# Patient Record
Sex: Male | Born: 1955 | Race: White | Hispanic: No | Marital: Single | Smoking: Current every day smoker
Health system: Southern US, Community
[De-identification: ages and names within clinical notes are randomized; demographics above are authoritative.]

---

## 2008-07-03 ENCOUNTER — Encounter (INDEPENDENT_AMBULATORY_CARE_PROVIDER_SITE_OTHER): Payer: Self-pay | Admitting: General Surgery

## 2008-07-03 ENCOUNTER — Observation Stay (HOSPITAL_COMMUNITY): Admission: EM | Admit: 2008-07-03 | Discharge: 2008-07-04 | Payer: Self-pay | Admitting: Emergency Medicine

## 2010-06-27 LAB — BASIC METABOLIC PANEL
BUN: 17 mg/dL (ref 6–23)
Creatinine, Ser: 1.37 mg/dL (ref 0.4–1.5)
GFR calc Af Amer: >60 mL/min (ref >60)
GFR calc non Af Amer: 55 mL/min — ABNORMAL LOW (ref >60)
Potassium: 3.7 mEq/L (ref 3.5–5.1)

## 2010-06-27 LAB — CBC
Platelets: 238 10*3/uL (ref 150–400)
WBC: 5.4 10*3/uL (ref 4.0–10.5)

## 2010-06-27 LAB — URINALYSIS, ROUTINE W REFLEX MICROSCOPIC
Ketones, ur: NEGATIVE mg/dL
Nitrite: NEGATIVE
Protein, ur: NEGATIVE mg/dL
pH: 6 (ref 5.0–8.0)

## 2010-06-27 LAB — DIFFERENTIAL
Lymphocytes Relative: 23 % (ref 12–46)
Lymphs Abs: 1.2 10*3/uL (ref 0.7–4.0)
Neutro Abs: 3.5 10*3/uL (ref 1.7–7.7)
Neutrophils Relative %: 64 % (ref 43–77)

## 2010-06-27 LAB — PROTIME-INR
INR: 1 (ref 0.00–1.49)
Prothrombin Time: 13.3 seconds (ref 11.6–15.2)

## 2010-07-31 NOTE — H&P (Signed)
Gabriel Estes, MCCUE NO.:  [id number]   MEDICAL RECORD NO.:  [id number]          PATIENT TYPE:  INP   LOCATION:  5121                         FACILITY:  [facility]   PHYSICIAN:  Juanetta Gosling, MDDATE OF BIRTH:  [birth year]   DATE OF ADMISSION:  07/03/2008  DATE OF DISCHARGE:                              HISTORY & PHYSICAL   CHIEF COMPLAINT:  Right groin pain.   CONSULTING PHYSICIAN:  Samuel Jester, DO   HISTORY OF PRESENT ILLNESS:  This is a 55 year old male with about a  month history of an increasing in size right groin bulge that over the  last 24 hours have become increasingly tender and he came to the  emergency room due to his significant tenderness, still passing gas, and  having bowel movements.  No nausea, vomiting, or fevers are noted.   PAST MEDICAL HISTORY:  Significant for that he is a recovering addict.   PAST SURGICAL HISTORY:  Left quadriceps and right foot surgery.   FAMILY HISTORY:  Noncontributory.   SOCIAL HISTORY:  Nonsmoker.  No alcohol.  He is a recovering drug  addict.   ALLERGIES:  No known drug allergies.   MEDICATIONS:  None.   REVIEW OF SYSTEMS:  Otherwise negative.   PHYSICAL EXAMINATION:  VITAL SIGNS:  98.0, 98, respirations 18.  GENERAL:  He is well-appearing male in no distress.  NECK:  Supple.  HEART:  Regular rate and rhythm.  LUNGS:  Clear bilaterally.  ABDOMEN:  Soft, nontender, nondistended.  GU:  No left inguinal hernia what appears to be an incarcerated right  inguinal hernia, is very tender although is very difficult to examine  him due to his tenderness.  The area in his scrotum is hard.  EXTREMITIES:  No edema.   LABORATORY EVALUATION:  A white blood cell count of 5.4, hematocrit  41.5.  Urinalysis negative.  BUN and creatinine are 17 and 1.37, glucose  of 91.   IMPRESSION:  Incarcerated right inguinal hernia.   PLAN:  Open right inguinal repair with mesh.      Juanetta Gosling, MD  Electronically Signed     MCW/MEDQ  D:  07/03/2008  T:  07/04/2008  Job:  [id number]

## 2010-07-31 NOTE — Op Note (Signed)
Gabriel Estes, Gabriel Estes NO.:  [id number]   MEDICAL RECORD NO.:  [id number]          PATIENT TYPE:  INP   LOCATION:  5121                         FACILITY:  [facility]   PHYSICIAN:  Juanetta Gosling, MDDATE OF BIRTH:  1955/12/03   DATE OF PROCEDURE:  DATE OF DISCHARGE:                               OPERATIVE REPORT   PREOPERATIVE DIAGNOSIS:  Incarcerated right inguinal hernia.   POSTOPERATIVE DIAGNOSES:  1. Right inguinal hernia direct.  2. Right hydrocele.   PROCEDURES:  1. Right inguinal repair with Ultrapro patch.  2. Right hydrocelectomy.   SURGEON:  Juanetta Gosling, MD   ASSISTANT:  Clovis Pu. Cornett, MD   ANESTHESIA:  General endotracheal.  Supervising anesthesiologist, Germaine Pomfret, MD   ESTIMATED BLOOD LOSS:  Minimal.   COMPLICATIONS:  None.   DRAINS:  None.   SPECIMENS:  Hydrocele to Pathology.   DISPOSITION:  To recovery room in stable condition.   INDICATIONS:  This is a 55 year old male with an increase in size right  groin mass over the last month that has over the last 24 hours have  become increasingly tender requiring him to come into the emergency  room.  On exam, it appeared that he had an incarcerated right inguinal  hernia with a very hard tender mass in his scrotum and we discussed  going to the operating room to repair that.   PROCEDURE:  After informed consent was obtained, the patient was first  administered 1 g of intravenous cefazolin.  Sequential compression  device was placed in the lower extremities prior to operation.  He was  placed under general endotracheal anesthesia without complication.  His  right groin was then prepped and draped in a standard sterile surgical  fashion.  A surgical time-out was then performed.   A right groin incision was then made and dissection was carried out down  to the level of external abdominal oblique.  There was a fair amount of  spleen of his external abdominal oblique  and a very large bulging area.  His external abdominal oblique was incised and this was carried out to  his internal ring.  Spermatic cord was encircled.  He did have a direct  hernia that I noted, but he also had a very large about 20 x 15-cm tense  right hydrocele.  This was drained with a fair amount of clear fluid  that came out of this.  Then hydrocele sac was then removed and the  testicles were removed to return to the scrotum.  Following this, a cord  lipoma was excised from his spermatic cord as well as the aforementioned  direct hernia was then repaired with an Ultrapro patch and was laid into  position.  This was sutured with 2-0 Prolene to the pubic tubercle, the  inguinal ligament, and to the internal oblique superiorly.  A T-cut was  made in the mesh, wrapped around the spermatic cord, and then tacked  together as well as to the inguinal ligament.  Again the lateral portion  was laid under the external abdominal oblique.  Hemostasis was observed.  The external abdominal oblique was then closed with a 2-0 Vicryl,  Scarpa's with a 3-0 Vicryl, the skin with a 4-0 Monocryl in a  subcuticular fashion.  Dermabond was then placed over this.  A 10 mL of  0.25% Marcaine with epinephrine were infiltrated in the wound as well as  performing ilioinguinal nerve block.  He tolerated this well and was  transferred to the recovery room in stable condition.      Juanetta Gosling, MD  Electronically Signed     MCW/MEDQ  D:  07/03/2008  T:  07/04/2008  Job:  [id number]

## 2016-03-08 ENCOUNTER — Emergency Department (HOSPITAL_COMMUNITY): Payer: Self-pay

## 2016-03-08 ENCOUNTER — Inpatient Hospital Stay (HOSPITAL_COMMUNITY)
Admission: EM | Admit: 2016-03-08 | Discharge: 2016-03-09 | DRG: 190 | Disposition: A | Discharge status: Home / Self Care | Payer: Self-pay | Attending: Internal Medicine | Admitting: Internal Medicine

## 2016-03-08 ENCOUNTER — Encounter (HOSPITAL_COMMUNITY): Payer: Self-pay | Admitting: Emergency Medicine

## 2016-03-08 DIAGNOSIS — Z7952 Long term (current) use of systemic steroids: Secondary | ICD-10-CM

## 2016-03-08 DIAGNOSIS — J9601 Acute respiratory failure with hypoxia: Secondary | ICD-10-CM

## 2016-03-08 DIAGNOSIS — J441 Chronic obstructive pulmonary disease with (acute) exacerbation: Principal | ICD-10-CM

## 2016-03-08 DIAGNOSIS — R05 Cough: Secondary | ICD-10-CM

## 2016-03-08 DIAGNOSIS — R079 Chest pain, unspecified: Secondary | ICD-10-CM

## 2016-03-08 DIAGNOSIS — R55 Syncope and collapse: Secondary | ICD-10-CM

## 2016-03-08 DIAGNOSIS — J189 Pneumonia, unspecified organism: Secondary | ICD-10-CM

## 2016-03-08 DIAGNOSIS — F17210 Nicotine dependence, cigarettes, uncomplicated: Secondary | ICD-10-CM | POA: Diagnosis present

## 2016-03-08 DIAGNOSIS — Z79899 Other long term (current) drug therapy: Secondary | ICD-10-CM

## 2016-03-08 LAB — I-STAT VENOUS BLOOD GAS, ED
Acid-base deficit: 1 mmol/L (ref 0.0–2.0)
Bicarbonate: 24.6 mmol/L (ref 20.0–28.0)
O2 SAT: 87 %
PCO2 VEN: 42.9 mmHg — AB (ref 44.0–60.0)
PH VEN: 7.367 (ref 7.250–7.430)
PO2 VEN: 56 mmHg — AB (ref 32.0–45.0)
TCO2: 26 mmol/L (ref 0–100)

## 2016-03-08 LAB — I-STAT TROPONIN, ED
TROPONIN I, POC: 0.01 ng/mL (ref 0.00–0.08)
TROPONIN I, POC: 0.01 ng/mL (ref 0.00–0.08)

## 2016-03-08 LAB — COMPREHENSIVE METABOLIC PANEL
ALBUMIN: 4 g/dL (ref 3.5–5.0)
ALT: 33 U/L (ref 17–63)
ANION GAP: 13 (ref 5–15)
AST: 41 U/L (ref 15–41)
Alkaline Phosphatase: 72 U/L (ref 38–126)
BILIRUBIN TOTAL: 0.9 mg/dL (ref 0.3–1.2)
BUN: 10 mg/dL (ref 6–20)
CO2: 21 mmol/L — AB (ref 22–32)
Calcium: 8.7 mg/dL — ABNORMAL LOW (ref 8.9–10.3)
Chloride: 108 mmol/L (ref 101–111)
Creatinine, Ser: 1.19 mg/dL (ref 0.61–1.24)
GFR calc non Af Amer: >60 mL/min (ref >60)
GLUCOSE: 84 mg/dL (ref 65–99)
POTASSIUM: 3.7 mmol/L (ref 3.5–5.1)
SODIUM: 142 mmol/L (ref 135–145)
TOTAL PROTEIN: 7.2 g/dL (ref 6.5–8.1)

## 2016-03-08 LAB — CBC WITH DIFFERENTIAL/PLATELET
BASOS PCT: 0 %
Basophils Absolute: 0.1 10*3/uL (ref 0.0–0.1)
EOS ABS: 0 10*3/uL (ref 0.0–0.7)
EOS PCT: 0 %
HCT: 41.6 % (ref 39.0–52.0)
Hemoglobin: 14.2 g/dL (ref 13.0–17.0)
Lymphocytes Relative: 13 %
Lymphs Abs: 2 10*3/uL (ref 0.7–4.0)
MCH: 31.1 pg (ref 26.0–34.0)
MCHC: 34.1 g/dL (ref 30.0–36.0)
MCV: 91 fL (ref 78.0–100.0)
MONO ABS: 0.7 10*3/uL (ref 0.1–1.0)
MONOS PCT: 5 %
Neutro Abs: 12.6 10*3/uL — ABNORMAL HIGH (ref 1.7–7.7)
Neutrophils Relative %: 82 %
Platelets: 280 10*3/uL (ref 150–400)
RBC: 4.57 MIL/uL (ref 4.22–5.81)
RDW: 12.7 % (ref 11.5–15.5)
WBC: 15.4 10*3/uL — ABNORMAL HIGH (ref 4.0–10.5)

## 2016-03-08 LAB — I-STAT CG4 LACTIC ACID, ED: Lactic Acid, Venous: 1.36 mmol/L (ref 0.5–1.9)

## 2016-03-08 LAB — BRAIN NATRIURETIC PEPTIDE: B NATRIURETIC PEPTIDE 5: 237.4 pg/mL — AB (ref 0.0–100.0)

## 2016-03-08 IMAGING — DX DG CHEST 2V
2 series · 2 of 2 positions shown · non-contrast
Comparison: None

CLINICAL DATA: Cough for 3 weeks, passed out after coughing episode
today

EXAM:
CHEST  2 VIEW

[chest pa]
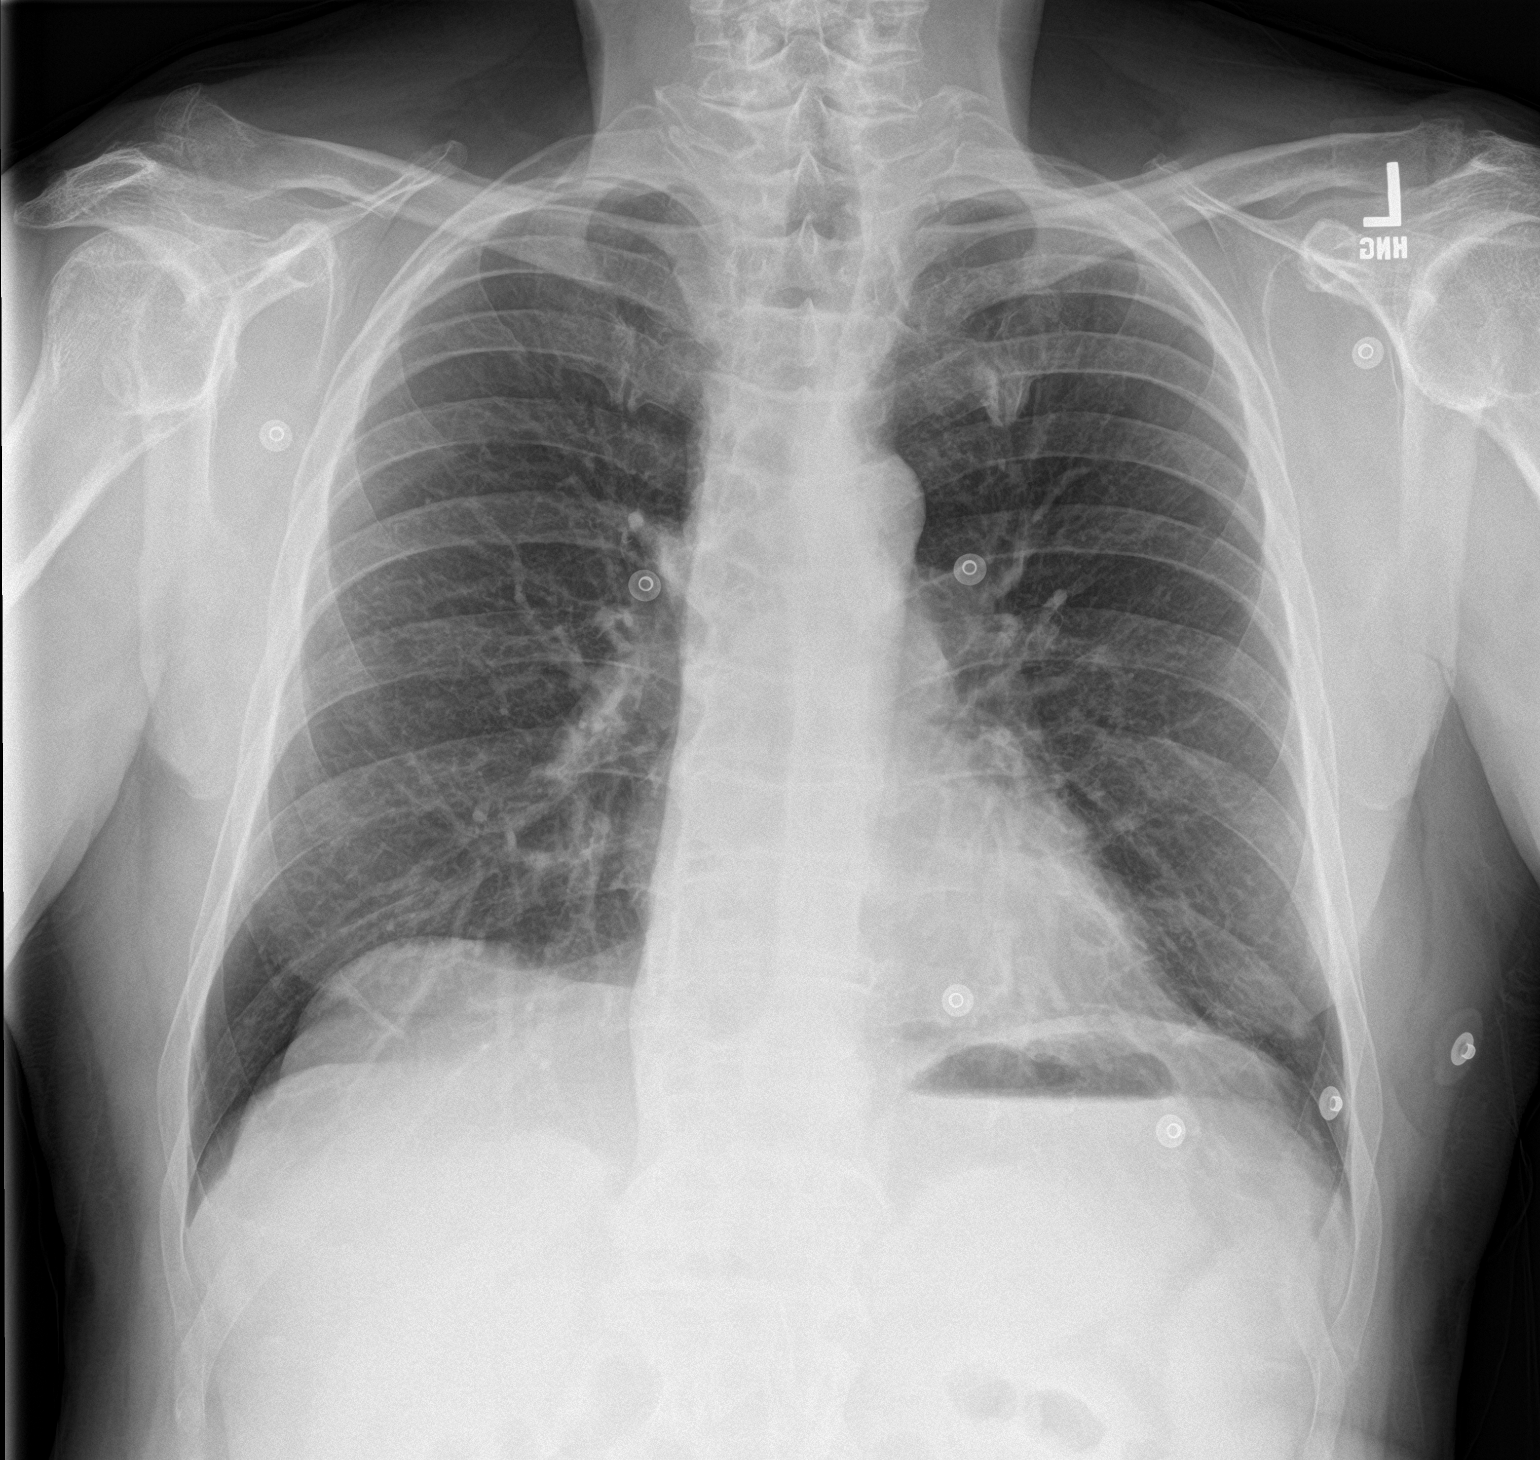

[chest lat]
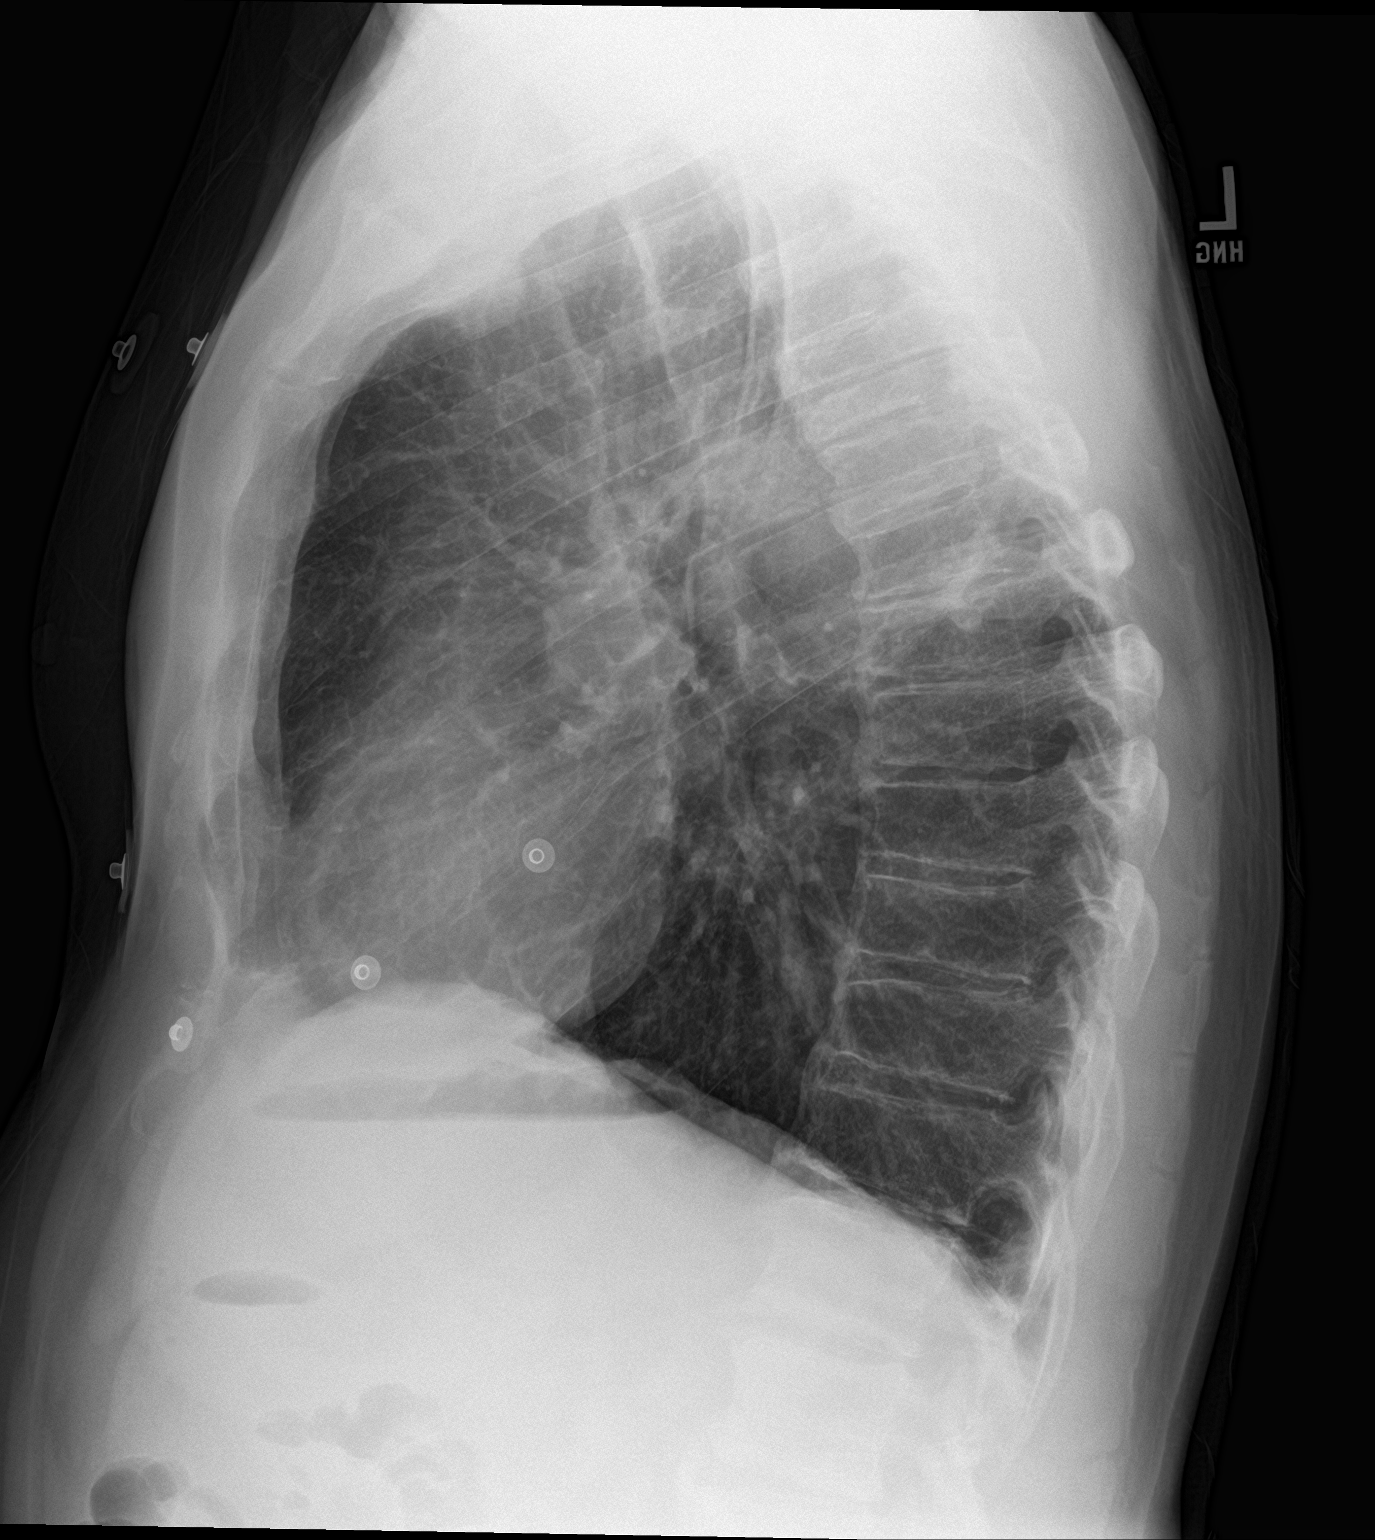

[2 of 2 positions shown; findings below may reference images not displayed]

FINDINGS: Normal heart size, mediastinal contours, and pulmonary vascularity.

Lungs hyperinflated but clear.

No pulmonary infiltrate, pleural effusion or pneumothorax.

Bones unremarkable.
IMPRESSION: Hyperinflated lungs without infiltrate.

## 2016-03-08 MED ORDER — AMOXICILLIN-POT CLAVULANATE 875-125 MG PO TABS: 1.0000 | ORAL_TABLET | Freq: Once | ORAL | Status: DC

## 2016-03-08 MED ORDER — SODIUM CHLORIDE 0.9 % IV BOLUS (SEPSIS)
1000.0000 mL | Freq: Once | INTRAVENOUS | Status: AC
  Administered 2016-03-08: 1000 mL via INTRAVENOUS

## 2016-03-08 MED ORDER — ALBUTEROL SULFATE HFA 108 (90 BASE) MCG/ACT IN AERS: 2.0000 | INHALATION_SPRAY | Freq: Once | RESPIRATORY_TRACT | Status: DC

## 2016-03-08 MED ORDER — MAGNESIUM SULFATE 2 GM/50ML IV SOLN
2.0000 g | Freq: Once | INTRAVENOUS | Status: AC
  Administered 2016-03-08: 2 g via INTRAVENOUS
  Filled 2016-03-08: qty 50

## 2016-03-08 MED ORDER — IOPAMIDOL (ISOVUE-370) INJECTION 76%
INTRAVENOUS | Status: AC
  Administered 2016-03-08: 100 mL
  Filled 2016-03-08: qty 100

## 2016-03-08 MED ORDER — IPRATROPIUM-ALBUTEROL 0.5-2.5 (3) MG/3ML IN SOLN
3.0000 mL | Freq: Once | RESPIRATORY_TRACT | Status: AC
  Administered 2016-03-08: 3 mL via RESPIRATORY_TRACT
  Filled 2016-03-08: qty 3

## 2016-03-08 MED ORDER — AMOXICILLIN-POT CLAVULANATE 875-125 MG PO TABS: 1.0000 | ORAL_TABLET | Freq: Two times a day (BID) | ORAL | 0 refills | Status: DC

## 2016-03-08 MED ORDER — HYDROCODONE-HOMATROPINE 5-1.5 MG/5ML PO SYRP: 5.0000 mL | ORAL_SOLUTION | Freq: Four times a day (QID) | ORAL | 0 refills | Status: DC | PRN

## 2016-03-08 MED ORDER — PREDNISONE 20 MG PO TABS: 60.0000 mg | ORAL_TABLET | Freq: Every day | ORAL | 0 refills | Status: DC

## 2016-03-08 MED ORDER — METHYLPREDNISOLONE SODIUM SUCC 125 MG IJ SOLR
125.0000 mg | Freq: Once | INTRAMUSCULAR | Status: AC
  Administered 2016-03-08: 125 mg via INTRAVENOUS
  Filled 2016-03-08: qty 2

## 2016-03-08 MED ORDER — AZITHROMYCIN 250 MG PO TABS: 500.0000 mg | ORAL_TABLET | Freq: Once | ORAL | Status: DC

## 2016-03-08 MED ORDER — DEXTROSE 5 % IV SOLN
500.0000 mg | Freq: Once | INTRAVENOUS | Status: AC
  Administered 2016-03-09: 500 mg via INTRAVENOUS
  Filled 2016-03-08: qty 500

## 2016-03-08 MED ORDER — SODIUM CHLORIDE 0.9 % IV BOLUS (SEPSIS)
1000.0000 mL | Freq: Once | INTRAVENOUS | Status: AC
Start: 2016-03-08 — End: 2016-03-09
  Administered 2016-03-08: 1000 mL via INTRAVENOUS

## 2016-03-08 MED ORDER — BENZONATATE 100 MG PO CAPS: 100.0000 mg | ORAL_CAPSULE | Freq: Three times a day (TID) | ORAL | 0 refills | Status: DC | PRN

## 2016-03-08 MED ORDER — AZITHROMYCIN 250 MG PO TABS: 250.0000 mg | ORAL_TABLET | Freq: Every day | ORAL | 0 refills | Status: DC

## 2016-03-08 NOTE — ED Notes (Signed)
Admitting at bedside 

## 2016-03-08 NOTE — ED Notes (Signed)
[place], [place] [facility], who stated [facility] [address] [place]. Relayed this information to pt's sig other, who says she is going to call [employer] to see if his truck is still there.

## 2016-03-08 NOTE — ED Notes (Signed)
Pt transported to xray 

## 2016-03-08 NOTE — ED Provider Notes (Signed)
[facility] Provider Note   CSN: [id number] Arrival date & time: 03/08/16  1827     History   Chief Complaint Chief Complaint  Patient presents with  . Shortness of Breath    HPI Gabriel Estes is a 60 y.o. male.  HPI 60 yo M with PMHx of tobacco use here with syncopal episode while coughing. Pt states that over the past several days, he has had progressively worsening cough, nasal congestion, and sputum production. He is having frequent, severe coughing spells followed by transient lightheadedness. Denies any associated chest pain. Earlier today, he was sitting in his car, parked, when he felt a spell come on. He believes that he then passed out and awoke to EMS. Currently, he states he feels SOB but denies any other complaints. No fevers. No h/o syncopal episodes. No coronary history. He does have known sick contacts.  History reviewed. No pertinent past medical history.  There are no active problems to display for this patient.   History reviewed. No pertinent surgical history.     Home Medications    Prior to Admission medications   Medication Sig Start Date End Date Taking? Authorizing Provider  amoxicillin-clavulanate (AUGMENTIN) 875-125 MG tablet Take 1 tablet by mouth every 12 (twelve) hours. 03/08/16 03/15/16  Shaune Pollackameron Shawnee Higham, MD  azithromycin (ZITHROMAX) 250 MG tablet Take 1 tablet (250 mg total) by mouth daily. 03/08/16 03/12/16  Shaune Pollackameron Jaylyne Breese, MD  benzonatate (TESSALON PERLES) 100 MG capsule Take 1 capsule (100 mg total) by mouth 3 (three) times daily as needed for cough. 03/08/16   Shaune Pollackameron Anam Lukas, MD  HYDROcodone-homatropine [facility](HYCODAN) 5-1.5 MG/5ML syrup Take 5 mLs by mouth every 6 (six) hours as needed for cough. 03/08/16   Shaune Pollackameron Yatziri Wainwright, MD  predniSONE (DELTASONE) 20 MG tablet Take 3 tablets (60 mg total) by mouth daily. 03/08/16 03/13/16  Shaune Pollackameron Eydie Wormley, MD    Family History History reviewed. No pertinent family history.  Social History Social  History  Substance Use Topics  . Smoking status: Current Every Day Smoker    Packs/day: 0.50    Types: Cigarettes  . Smokeless tobacco: Never Used     Comment: would like to try Chantix  . Alcohol use Yes     Comment: occ     Allergies   Patient has no known allergies.   Review of Systems Review of Systems  Constitutional: Positive for chills and fatigue. Negative for fever.  HENT: Negative for congestion and rhinorrhea.   Eyes: Negative for visual disturbance.  Respiratory: Positive for cough, shortness of breath and wheezing.   Cardiovascular: Negative for chest pain and leg swelling.  Gastrointestinal: Negative for abdominal pain, diarrhea, nausea and vomiting.  Genitourinary: Negative for dysuria and flank pain.  Musculoskeletal: Negative for neck pain and neck stiffness.  Skin: Negative for rash and wound.  Allergic/Immunologic: Negative for immunocompromised state.  Neurological: Positive for syncope. Negative for weakness and headaches.  All other systems reviewed and are negative.    Physical Exam Updated Vital Signs BP 124/92   Pulse 107   Temp 97.9 F (36.6 C)   Resp 16   SpO2 95%   Physical Exam  Constitutional: He is oriented to person, place, and time. He appears well-developed and well-nourished. No distress.  HENT:  Head: Normocephalic and atraumatic.  Eyes: Conjunctivae are normal.  Neck: Neck supple.  Cardiovascular: Regular rhythm and normal heart sounds.  Tachycardia present.  Exam reveals no friction rub.   No murmur heard. Pulmonary/Chest: Effort normal. Tachypnea noted. No  respiratory distress. He has decreased breath sounds. He has wheezes in the right lower field and the left lower field. He has no rales.  Abdominal: He exhibits no distension.  Musculoskeletal: He exhibits no edema.  Neurological: He is alert and oriented to person, place, and time. He exhibits normal muscle tone.  Skin: Skin is warm. Capillary refill takes less than 2  seconds.  Psychiatric: He has a normal mood and affect.  Nursing note and vitals reviewed.    ED Treatments / Results  Labs (all labs ordered are listed, but only abnormal results are displayed) Labs Reviewed  CBC WITH DIFFERENTIAL/PLATELET - Abnormal; Notable for the following:       Result Value   WBC 15.4 (*)    Neutro Abs 12.6 (*)    All other components within normal limits  COMPREHENSIVE METABOLIC PANEL - Abnormal; Notable for the following:    CO2 21 (*)    Calcium 8.7 (*)    All other components within normal limits  BRAIN NATRIURETIC PEPTIDE - Abnormal; Notable for the following:    B Natriuretic Peptide 237.4 (*)    All other components within normal limits  I-STAT VENOUS BLOOD GAS, ED - Abnormal; Notable for the following:    pCO2, Ven 42.9 (*)    pO2, Ven 56.0 (*)    All other components within normal limits  BORDETELLA PERTUSSIS PCR  INFLUENZA PANEL BY PCR (TYPE A & B, H1N1)  I-STAT CG4 LACTIC ACID, ED  I-STAT TROPOININ, ED  I-STAT CG4 LACTIC ACID, ED  Rosezena SensorI-STAT TROPOININ, ED    EKG  EKG Interpretation  Date/Time:  Friday March 08 2016 18:44:49 EST Ventricular Rate:  108 PR Interval:    QRS Duration: 90 QT Interval:  356 QTC Calculation: 478 R Axis:   124 Text Interpretation:  Sinus tachycardia Probable right ventricular hypertrophy Borderline prolonged QT interval No old tracing to compare Confirmed by Josyah Achor MD, Lavell Ridings [phone]([id number]) on 03/08/2016 6:47:40 PM       Radiology Dg Chest 2 View  Result Date: 03/08/2016 CLINICAL DATA:  Cough for 3 weeks, passed out after coughing episode today EXAM: CHEST  2 VIEW COMPARISON:  None FINDINGS: Normal heart size, mediastinal contours, and pulmonary vascularity. Lungs hyperinflated but clear. No pulmonary infiltrate, pleural effusion or pneumothorax. Bones unremarkable. IMPRESSION: Hyperinflated lungs without infiltrate. Electronically Signed   By: Ulyses SouthwardMark  Boles M.D.   On: 03/08/2016 19:19   Ct Angio Chest Pe W Or  Wo Contrast  Result Date: 03/08/2016 CLINICAL DATA:  Chest pain with shortness of breath EXAM: CT ANGIOGRAPHY CHEST WITH CONTRAST TECHNIQUE: Multidetector CT imaging of the chest was performed using the standard protocol during bolus administration of intravenous contrast. Multiplanar CT image reconstructions and MIPs were obtained to evaluate the vascular anatomy. CONTRAST:  100 mL Isovue 370 intravenous COMPARISON:  Chest x-ray 03/08/2016 FINDINGS: Cardiovascular: Satisfactory opacification of the pulmonary arteries to the segmental level. No evidence of pulmonary embolism. Normal heart size. No pericardial effusion. No aortic dissection. Thoracic aorta is non aneurysmal. Minimal coronary artery calcifications. Mediastinum/Nodes: No significantly enlarged mediastinal or hilar lymph nodes. Trachea is midline. Thyroid normal. Esophagus within normal limits. Lungs/Pleura: No pneumothorax. No pleural effusion or consolidation. Minimal right upper lobe subpleural hazy density with minimal centrilobular pattern. Upper Abdomen: No acute abnormalities in the upper abdomen. Musculoskeletal: Degenerative changes.  No suspicious bone lesions. Review of the MIP images confirms the above findings. IMPRESSION: 1. No definite CT evidence for acute pulmonary embolus or aortic dissection 2.  Minimal hazy density and questionable centrilobular pattern in the peripheral right upper lobe could relate to small focus of pneumonitis or small airways infection/bronchiolitis. There is no focal pneumonia or pleural effusion identified. Electronically Signed   By: Jasmine Pang M.D.   On: 03/08/2016 21:41    Procedures Procedures (including critical care time)  Medications Ordered in ED Medications  sodium chloride 0.9 % bolus 1,000 mL (not administered)  ipratropium-albuterol (DUONEB) 0.5-2.5 (3) MG/3ML nebulizer solution 3 mL (not administered)  azithromycin (ZITHROMAX) 500 mg in dextrose 5 % 250 mL IVPB (not administered)    magnesium sulfate IVPB 2 g 50 mL (not administered)  sodium chloride 0.9 % bolus 1,000 mL (0 mLs Intravenous Stopped 03/08/16 2138)  methylPREDNISolone sodium succinate (SOLU-MEDROL) 125 mg/2 mL injection 125 mg (125 mg Intravenous Given 03/08/16 1917)  ipratropium-albuterol (DUONEB) 0.5-2.5 (3) MG/3ML nebulizer solution 3 mL (3 mLs Nebulization Given 03/08/16 1917)  ipratropium-albuterol (DUONEB) 0.5-2.5 (3) MG/3ML nebulizer solution 3 mL (3 mLs Nebulization Given 03/08/16 2136)  iopamidol (ISOVUE-370) 76 % injection (100 mLs  Contrast Given 03/08/16 2101)     Initial Impression / Assessment and Plan / ED Course  I have reviewed the triage vital signs and the nursing notes.  Pertinent labs & imaging results that were available during my care of the patient were reviewed by me and considered in my medical decision making (see chart for details).  Clinical Course     60 yo M with PMHx of tobacco dependence here with syncopal episode after coughing, with several days of cough, SOB, wheezing. On arrival, pt tachycardic, tachypneic, with diffuse wheezing. CXR clear. CTA PE obtained 2/2 syncope with CP and shows small airway disease, no focal PNA, and no PE. Labs o/w with mild, likely reactive leukocytosis and mild dehydration. Suspect COPD/WARI with cough syncope. Pt persistently wheezing after steroids, nebs x 3 and is hypoxic to 86% on RA when in room, even at rest. Will admit for hypoxic resp failure ad continued management. Will also add on pertussis and start azithro.  Final Clinical Impressions(s) / ED Diagnoses   Final diagnoses:  Chest pain  Community acquired pneumonia of right lung, unspecified part of lung  Cough syncope      Shaune Pollack, MD 03/08/16 2222

## 2016-03-08 NOTE — ED Triage Notes (Signed)
Per [facility]: Pt to ED after [facility] [place] [facility] [place] [facility] Fire[facility] got him out and administered Narcan, pt denies using drugs/alcohol. He did come around soon after and vomited en route. Pt c/o upper respiratory symptoms the past couple weeks and general "not feeling well." Pt given 4mg  zofran and 5mg  albuterol neb. EMS VS: 92% RA, 96% on 4L O2 [place]. NSR 92, 124/86, RR 16. Pt initially repetitive with questions after coming to, but states the medics and firemen were just overwhelming. Pt currently A&O x 4, denies pain. States the last thing he remembers is sitting in his truck having a bad coughing spell. Resp e/u at this time.

## 2016-03-08 NOTE — H&P (Addendum)
Gabriel Estes:[id number] DOB: January 23, 1956 DOA: 03/08/2016     PCP: [facility]ists: none Patient coming from:   home Lives alone,       Chief Complaint: syncope after a coughing spell   HPI: Gabriel Estes is a 60 y.o. male with medical history significant of tobacco abuse    Presented with episode of syncope that happened after he was having severe coughing spasm he was found unresponsive and pickup truck at the [place] station 5 department administered Narcan patient denies any drug abuse patient did Y Coppock. Patient had an episode of vomiting and Route reports has been having shortness of breath and coughing and wheezing for past few weeks and overall not feeling well. In route patient was given 5 mg of albuterol was found to have 92% on Ramirez 96% 4 L initial heart rate 92 BP 124/84 he had had a pain or chest pains or significant shortness of breath states he passed out after he was having severe coughing spell she remembers feeling lightheaded and going out. Cough with significant sputum production he also had had some Raynaud's. Patient is a smoker but currently interested in quitting he have not had any fevers or chills no prior syncopal episodes has no history of coronary artery disease status denies any sick contacts. Reports some night sweats.  He reports right side discomfort with movement.    IN ER:  Temp (24hrs), Avg:97.9 F (36.6 C), Min:97.9 F (36.6 C), Max:97.9 F (36.6 C)   RR 26 satting 96% on 3 L HR 113 BP 124/92   troponin 0.01 lactic acid 1.36 VBG 7.367/42.9 WBC 15.4 cranial 1.19 which is baseline BNP 237 Patient was noted to be wheezing in emergency department chest x-ray show no pneumonia CT injury or no PE he was noted to be down to 86% on room air while in the room especially after coughing spell.  Following Medications were ordered in ER: Medications  sodium chloride 0.9 % bolus 1,000 mL (not administered)  ipratropium-albuterol (DUONEB)  0.5-2.5 (3) MG/3ML nebulizer solution 3 mL (not administered)  azithromycin (ZITHROMAX) 500 mg in dextrose 5 % 250 mL IVPB (not administered)  magnesium sulfate IVPB 2 g 50 mL (not administered)  sodium chloride 0.9 % bolus 1,000 mL (0 mLs Intravenous Stopped 03/08/16 2138)  methylPREDNISolone sodium succinate (SOLU-MEDROL) 125 mg/2 mL injection 125 mg (125 mg Intravenous Given 03/08/16 1917)  ipratropium-albuterol (DUONEB) 0.5-2.5 (3) MG/3ML nebulizer solution 3 mL (3 mLs Nebulization Given 03/08/16 1917)  ipratropium-albuterol (DUONEB) 0.5-2.5 (3) MG/3ML nebulizer solution 3 mL (3 mLs Nebulization Given 03/08/16 2136)  iopamidol (ISOVUE-370) 76 % injection (100 mLs  Contrast Given 03/08/16 2101)     Hospitalist was called for admission for Syncope the setting of severe coughing spell patient with acute respiratory failure hypoxia likely secondary to COPD exacerbation  Review of Systems:    Pertinent positives include: shortness of breath at rest dyspnea on exertion, excess mucus,   productive cough, wheezing. vomiting, Constitutional:  No weight loss, night sweats, Fevers, chills, fatigue, weight loss  HEENT:  No headaches, Difficulty swallowing,Tooth/dental problems,Sore throat,  No sneezing, itching, ear ache, nasal congestion, post nasal drip,  Cardio-vascular:  No chest pain, Orthopnea, PND, anasarca, dizziness, palpitations.no Bilateral lower extremity swelling  GI:  No heartburn, indigestion, abdominal pain, nausea, diarrhea, change in bowel habits, loss of appetite, melena, blood in stool, hematemesis Resp:  no .  No non-productive cough, No coughing up of blood.No change in color of mucus.No  Skin:  no rash or lesions. No jaundice GU:  no dysuria, change in color of urine, no urgency or frequency. No straining to urinate.  No flank pain.  Musculoskeletal:  No joint pain or no joint swelling. No decreased range of motion. No back pain.  Psych:  No change in mood or affect.  No depression or anxiety. No memory loss.  Neuro: no localizing neurological complaints, no tingling, no weakness, no double vision, no gait abnormality, no slurred speech, no confusion  As per HPI otherwise 10 point review of systems negative.   Past Medical History: History reviewed. No pertinent past medical history. History reviewed. No pertinent surgical history.   Social History:  Ambulatory   Independently     reports that he has been smoking Cigarettes.  He has been smoking about 0.50 packs per day. He has never used smokeless tobacco. He reports that he drinks alcohol. He reports that he does not use drugs.  Allergies:  No Known Allergies     Family History:   Family History  Problem Relation Age of Onset  . ALS Father   . Cancer Sister   . Hypertension Sister   . Stroke Other   . Cancer Other   . CAD Neg Hx   . Diabetes Neg Hx     Medications: Prior to Admission medications   Medication Sig Start Date End Date Taking? Authorizing Provider  amoxicillin-clavulanate (AUGMENTIN) 875-125 MG tablet Take 1 tablet by mouth every 12 (twelve) hours. 03/08/16 03/15/16  Shaune Pollackameron Isaacs, MD  azithromycin (ZITHROMAX) 250 MG tablet Take 1 tablet (250 mg total) by mouth daily. 03/08/16 03/12/16  Shaune Pollackameron Isaacs, MD  benzonatate (TESSALON PERLES) 100 MG capsule Take 1 capsule (100 mg total) by mouth 3 (three) times daily as needed for cough. 03/08/16   Shaune Pollackameron Isaacs, MD  HYDROcodone-homatropine [facility](HYCODAN) 5-1.5 MG/5ML syrup Take 5 mLs by mouth every 6 (six) hours as needed for cough. 03/08/16   Shaune Pollackameron Isaacs, MD  predniSONE (DELTASONE) 20 MG tablet Take 3 tablets (60 mg total) by mouth daily. 03/08/16 03/13/16  Shaune Pollackameron Isaacs, MD    Physical Exam: Patient Vitals for the past 24 hrs:  BP Temp Pulse Resp SpO2  03/08/16 2250 - - 113 26 96 %  03/08/16 2045 124/92 - 107 16 -  03/08/16 2030 142/91 - 107 22 95 %  03/08/16 2015 138/81 - 109 18 96 %  03/08/16 2000 136/90 - 98 21 96  %  03/08/16 1945 129/82 - 112 - 90 %  03/08/16 1845 118/85 - 109 13 99 %  03/08/16 1838 121/81 97.9 F (36.6 C) 99 20 90 %  03/08/16 1827 - - - - 96 %    1. General:  in No Acute distress 2. Psychological: Alert and    Oriented 3. Head/ENT:   Dry Mucous Membranes                          Head Non traumatic, neck supple                            Poor Dentition 4. SKIN:   decreased Skin turgor,  Skin clean Dry and intact no rash, numerous skin lesions consistent with sun damage possible lesion on the upper back worrisome for malignancy  5. Heart: Regular rate and rhythm no Murmur, Rub or gallop 6. Lungs: Some wheezes no crackles  Coarse breath sounds 7. Abdomen: Soft,  non-tender, Non distended 8. Lower extremities: no clubbing, cyanosis, or edema 9. Neurologically Grossly intact, moving all 4 extremities equally  10. MSK: Normal range of motion   body mass index is unknown because there is no height or weight on file.  Labs on Admission:   Labs on Admission: I have personally reviewed following labs and imaging studies  CBC:  Recent Labs Lab 03/08/16 1937  WBC 15.4*  NEUTROABS 12.6*  HGB 14.2  HCT 41.6  MCV 91.0  PLT 280   Basic Metabolic Panel:  Recent Labs Lab 03/08/16 1937  NA 142  K 3.7  CL 108  CO2 21*  GLUCOSE 84  BUN 10  CREATININE 1.19  CALCIUM 8.7*   GFR: CrCl cannot be calculated (Unknown ideal weight.). Liver Function Tests:  Recent Labs Lab 03/08/16 1937  AST 41  ALT 33  ALKPHOS 72  BILITOT 0.9  PROT 7.2  ALBUMIN 4.0   No results for input(s): LIPASE, AMYLASE in the last 168 hours. No results for input(s): AMMONIA in the last 168 hours. Coagulation Profile: No results for input(s): INR, PROTIME in the last 168 hours. Cardiac Enzymes: No results for input(s): CKTOTAL, CKMB, CKMBINDEX, TROPONINI in the last 168 hours. BNP (last 3 results) No results for input(s): PROBNP in the last 8760 hours. HbA1C: No results for input(s):  HGBA1C in the last 72 hours. CBG: No results for input(s): GLUCAP in the last 168 hours. Lipid Profile: No results for input(s): CHOL, HDL, LDLCALC, TRIG, CHOLHDL, LDLDIRECT in the last 72 hours. Thyroid Function Tests: No results for input(s): TSH, T4TOTAL, FREET4, T3FREE, THYROIDAB in the last 72 hours. Anemia Panel: No results for input(s): VITAMINB12, FOLATE, FERRITIN, TIBC, IRON, RETICCTPCT in the last 72 hours.  Sepsis Labs: @LABRCNTIP (procalcitonin:4,lacticidven:4) )No results found for this or any previous visit (from the past 240 hour(s)).     UA ordered  No results found for: HGBA1C  CrCl cannot be calculated (Unknown ideal weight.).  BNP (last 3 results) No results for input(s): PROBNP in the last 8760 hours.   ECG REPORT  Independently reviewed Rate: 108  Rhythm: Sinus tachycardia ST&T Change: No acute ischemic changes  QTC 478  There were no vitals filed for this visit.   Cultures: No results found for: SDES, SPECREQUEST, CULT, REPTSTATUS   Radiological Exams on Admission: Dg Chest 2 View  Result Date: 03/08/2016 CLINICAL DATA:  Cough for 3 weeks, passed out after coughing episode today EXAM: CHEST  2 VIEW COMPARISON:  None FINDINGS: Normal heart size, mediastinal contours, and pulmonary vascularity. Lungs hyperinflated but clear. No pulmonary infiltrate, pleural effusion or pneumothorax. Bones unremarkable. IMPRESSION: Hyperinflated lungs without infiltrate. Electronically Signed   By: Ulyses Southward M.D.   On: 03/08/2016 19:19   Ct Angio Chest Pe W Or Wo Contrast  Result Date: 03/08/2016 CLINICAL DATA:  Chest pain with shortness of breath EXAM: CT ANGIOGRAPHY CHEST WITH CONTRAST TECHNIQUE: Multidetector CT imaging of the chest was performed using the standard protocol during bolus administration of intravenous contrast. Multiplanar CT image reconstructions and MIPs were obtained to evaluate the vascular anatomy. CONTRAST:  100 mL Isovue 370 intravenous  COMPARISON:  Chest x-ray 03/08/2016 FINDINGS: Cardiovascular: Satisfactory opacification of the pulmonary arteries to the segmental level. No evidence of pulmonary embolism. Normal heart size. No pericardial effusion. No aortic dissection. Thoracic aorta is non aneurysmal. Minimal coronary artery calcifications. Mediastinum/Nodes: No significantly enlarged mediastinal or hilar lymph nodes. Trachea is midline. Thyroid normal. Esophagus within normal limits. Lungs/Pleura: No pneumothorax. No  pleural effusion or consolidation. Minimal right upper lobe subpleural hazy density with minimal centrilobular pattern. Upper Abdomen: No acute abnormalities in the upper abdomen. Musculoskeletal: Degenerative changes.  No suspicious bone lesions. Review of the MIP images confirms the above findings. IMPRESSION: 1. No definite CT evidence for acute pulmonary embolus or aortic dissection 2. Minimal hazy density and questionable centrilobular pattern in the peripheral right upper lobe could relate to small focus of pneumonitis or small airways infection/bronchiolitis. There is no focal pneumonia or pleural effusion identified. Electronically Signed   By: Jasmine PangKim  Fujinaga M.D.   On: 03/08/2016 21:41    Chart has been reviewed    Assessment/Plan  60 y.o. male with medical history significant of tobacco abuse  Syncope the setting of severe coughing spell patient with acute respiratory failure hypoxia likely secondary to COPD exacerbation   Present on Admission: . COPD exacerbation (HCC) -   Will initiate: Steroid taper  -  Antibiotics  Doxycycline, -   XopenexPRN, - scheduled duoneb,  -  Breo or Dulera at discharge   -  Mucinex.  Titrate O2 to saturation >90%. Follow patients respiratory status.   . Syncope - in the setting of severe coughing episode will  given risk factor will admit rehydrate obtain CE, Echo gram monitor on tele and obtain carotid dopplers  . Acute respiratory failure with hypoxia (HCC) - likely  due to COPD exacerbation no PE on CTA  Prior to DC will need to be evaluated for home oxygen Skin lesions recommended follow up with Dermatology patient and family understands.   Other plan as per orders.  DVT prophylaxis:    Lovenox     Code Status:  FULL CODE as per patient    Family Communication:   Family not  at  Bedside  plan of care was discussed with  Pastor/girlfriend at bedside.   Disposition Plan:    To home once workup is complete and patient is stable                                          Consults called: none    Admission status: inpatient    Level of care     tele      I have spent a total of 57 min on this admission  Geordan Xu 03/08/2016, 11:36 PM    [facility]  Pager [phone]   after 2 AM please page floor coverage PA If 7AM-7PM, please contact the day team taking care of the patient  [email or web address]  Password [email or web address]

## 2016-03-09 ENCOUNTER — Inpatient Hospital Stay (HOSPITAL_COMMUNITY): Payer: Self-pay

## 2016-03-09 LAB — MAGNESIUM: MAGNESIUM: 2.3 mg/dL (ref 1.7–2.4)

## 2016-03-09 LAB — CBC
HEMATOCRIT: 37.5 % — AB (ref 39.0–52.0)
HEMOGLOBIN: 12.6 g/dL — AB (ref 13.0–17.0)
MCH: 31 pg (ref 26.0–34.0)
MCHC: 33.6 g/dL (ref 30.0–36.0)
MCV: 92.1 fL (ref 78.0–100.0)
Platelets: 296 10*3/uL (ref 150–400)
RBC: 4.07 MIL/uL — AB (ref 4.22–5.81)
RDW: 13.1 % (ref 11.5–15.5)
WBC: 15.1 10*3/uL — AB (ref 4.0–10.5)

## 2016-03-09 LAB — URINALYSIS, ROUTINE W REFLEX MICROSCOPIC
BILIRUBIN URINE: NEGATIVE
GLUCOSE, UA: 150 mg/dL — AB
HGB URINE DIPSTICK: NEGATIVE
Ketones, ur: NEGATIVE mg/dL
Leukocytes, UA: NEGATIVE
Nitrite: NEGATIVE
PROTEIN: NEGATIVE mg/dL
Specific Gravity, Urine: 1.032 — ABNORMAL HIGH (ref 1.005–1.030)
pH: 6 (ref 5.0–8.0)

## 2016-03-09 LAB — RAPID URINE DRUG SCREEN, HOSP PERFORMED
AMPHETAMINES: NOT DETECTED
BARBITURATES: NOT DETECTED
BENZODIAZEPINES: NOT DETECTED
COCAINE: NOT DETECTED
Opiates: POSITIVE — AB
TETRAHYDROCANNABINOL: NOT DETECTED

## 2016-03-09 LAB — COMPREHENSIVE METABOLIC PANEL
ALBUMIN: 3.7 g/dL (ref 3.5–5.0)
ALK PHOS: 63 U/L (ref 38–126)
ALT: 30 U/L (ref 17–63)
ANION GAP: 10 (ref 5–15)
AST: 42 U/L — ABNORMAL HIGH (ref 15–41)
BILIRUBIN TOTAL: 0.8 mg/dL (ref 0.3–1.2)
BUN: 14 mg/dL (ref 6–20)
CALCIUM: 8.3 mg/dL — AB (ref 8.9–10.3)
CO2: 22 mmol/L (ref 22–32)
CREATININE: 1.13 mg/dL (ref 0.61–1.24)
Chloride: 110 mmol/L (ref 101–111)
GFR calc non Af Amer: >60 mL/min (ref >60)
GLUCOSE: 147 mg/dL — AB (ref 65–99)
Potassium: 4.1 mmol/L (ref 3.5–5.1)
Sodium: 142 mmol/L (ref 135–145)
TOTAL PROTEIN: 6.8 g/dL (ref 6.5–8.1)

## 2016-03-09 LAB — TSH: TSH: 0.4 u[IU]/mL (ref 0.350–4.500)

## 2016-03-09 LAB — INFLUENZA PANEL BY PCR (TYPE A & B)
INFLAPCR: NEGATIVE
INFLBPCR: NEGATIVE

## 2016-03-09 LAB — TROPONIN I
Troponin I: <0.03 ng/mL (ref <0.03)
Troponin I: 0.04 ng/mL (ref <0.03)

## 2016-03-09 LAB — PHOSPHORUS: Phosphorus: 2.2 mg/dL — ABNORMAL LOW (ref 2.5–4.6)

## 2016-03-09 MED ORDER — SODIUM CHLORIDE 0.9 % IV SOLN
INTRAVENOUS | Status: DC
  Administered 2016-03-09: 03:00:00 via INTRAVENOUS

## 2016-03-09 MED ORDER — GUAIFENESIN ER 600 MG PO TB12: 600.0000 mg | ORAL_TABLET | Freq: Two times a day (BID) | ORAL | 0 refills | Status: AC

## 2016-03-09 MED ORDER — SODIUM CHLORIDE 0.9% FLUSH
3.0000 mL | Freq: Two times a day (BID) | INTRAVENOUS | Status: DC
  Administered 2016-03-09 (×2): 3 mL via INTRAVENOUS

## 2016-03-09 MED ORDER — ORAL CARE MOUTH RINSE
15.0000 mL | Freq: Two times a day (BID) | OROMUCOSAL | Status: DC
  Administered 2016-03-09 (×2): 15 mL via OROMUCOSAL

## 2016-03-09 MED ORDER — BENZONATATE 100 MG PO CAPS: 100.0000 mg | ORAL_CAPSULE | Freq: Three times a day (TID) | ORAL | 0 refills | Status: AC | PRN

## 2016-03-09 MED ORDER — DOXYCYCLINE HYCLATE 100 MG IV SOLR
100.0000 mg | Freq: Two times a day (BID) | INTRAVENOUS | Status: DC
  Filled 2016-03-09: qty 100

## 2016-03-09 MED ORDER — PANTOPRAZOLE SODIUM 40 MG PO TBEC
40.0000 mg | DELAYED_RELEASE_TABLET | Freq: Every day | ORAL | Status: DC
  Administered 2016-03-09: 40 mg via ORAL
  Filled 2016-03-09 (×2): qty 1

## 2016-03-09 MED ORDER — HYDROCODONE-ACETAMINOPHEN 5-325 MG PO TABS
1.0000 | ORAL_TABLET | ORAL | Status: DC | PRN
  Administered 2016-03-09: 2 via ORAL
  Filled 2016-03-09: qty 2

## 2016-03-09 MED ORDER — LEVALBUTEROL HCL 0.63 MG/3ML IN NEBU: 0.6300 mg | INHALATION_SOLUTION | RESPIRATORY_TRACT | Status: DC | PRN

## 2016-03-09 MED ORDER — GUAIFENESIN ER 600 MG PO TB12
600.0000 mg | ORAL_TABLET | Freq: Two times a day (BID) | ORAL | Status: DC
  Administered 2016-03-09 (×2): 600 mg via ORAL
  Filled 2016-03-09 (×2): qty 1

## 2016-03-09 MED ORDER — HYDROCODONE-HOMATROPINE 5-1.5 MG/5ML PO SYRP: 5.0000 mL | ORAL_SOLUTION | Freq: Four times a day (QID) | ORAL | 0 refills | Status: AC | PRN

## 2016-03-09 MED ORDER — METHYLPREDNISOLONE SODIUM SUCC 125 MG IJ SOLR
60.0000 mg | Freq: Two times a day (BID) | INTRAMUSCULAR | Status: DC
  Administered 2016-03-09: 60 mg via INTRAVENOUS
  Filled 2016-03-09: qty 2

## 2016-03-09 MED ORDER — ACETAMINOPHEN-CODEINE #3 300-30 MG PO TABS: 1.0000 | ORAL_TABLET | Freq: Three times a day (TID) | ORAL | 0 refills | Status: DC | PRN

## 2016-03-09 MED ORDER — ACETAMINOPHEN 650 MG RE SUPP: 650.0000 mg | Freq: Four times a day (QID) | RECTAL | Status: DC | PRN

## 2016-03-09 MED ORDER — AMOXICILLIN-POT CLAVULANATE 875-125 MG PO TABS: 1.0000 | ORAL_TABLET | Freq: Two times a day (BID) | ORAL | 0 refills | Status: AC

## 2016-03-09 MED ORDER — IPRATROPIUM-ALBUTEROL 0.5-2.5 (3) MG/3ML IN SOLN
3.0000 mL | Freq: Four times a day (QID) | RESPIRATORY_TRACT | Status: DC
  Administered 2016-03-09 (×2): 3 mL via RESPIRATORY_TRACT
  Filled 2016-03-09 (×2): qty 3

## 2016-03-09 MED ORDER — ACETAMINOPHEN 325 MG PO TABS: 650.0000 mg | ORAL_TABLET | Freq: Four times a day (QID) | ORAL | Status: DC | PRN

## 2016-03-09 MED ORDER — PREDNISONE 10 MG PO TABS: ORAL_TABLET | ORAL | 0 refills | Status: AC

## 2016-03-09 MED ORDER — ONDANSETRON HCL 4 MG PO TABS: 4.0000 mg | ORAL_TABLET | Freq: Four times a day (QID) | ORAL | Status: DC | PRN

## 2016-03-09 MED ORDER — ONDANSETRON HCL 4 MG/2ML IJ SOLN: 4.0000 mg | Freq: Four times a day (QID) | INTRAMUSCULAR | Status: DC | PRN

## 2016-03-09 MED ORDER — ENOXAPARIN SODIUM 40 MG/0.4ML ~~LOC~~ SOLN
40.0000 mg | Freq: Every day | SUBCUTANEOUS | Status: DC
  Filled 2016-03-09: qty 0.4

## 2016-03-09 MED ORDER — AZITHROMYCIN 250 MG PO TABS: 250.0000 mg | ORAL_TABLET | Freq: Every day | ORAL | 0 refills | Status: AC

## 2016-03-09 MED ORDER — POLYETHYLENE GLYCOL 3350 17 G PO PACK
17.0000 g | PACK | Freq: Every day | ORAL | 0 refills | Status: AC | PRN
Start: 2016-03-09 — End: ?

## 2016-03-09 MED ORDER — POLYETHYLENE GLYCOL 3350 17 G PO PACK: 17.0000 g | PACK | Freq: Every day | ORAL | Status: DC | PRN

## 2016-03-09 MED ORDER — SODIUM CHLORIDE 0.9 % IV BOLUS (SEPSIS)
500.0000 mL | Freq: Once | INTRAVENOUS | Status: AC
  Administered 2016-03-09: 500 mL via INTRAVENOUS

## 2016-03-09 NOTE — Progress Notes (Signed)
Patient with no complaints or concerns during 7pm - 7am shift.  Kyna Blahnik, RN 

## 2016-03-09 NOTE — Discharge Summary (Signed)
Pt got discharged, discharge instructions provided and patient showed understanding to it, IV taken out,Telemonitor DC,pt left unit in ambulatory with all of the belongings.

## 2016-03-09 NOTE — Progress Notes (Signed)
New Admission Note:   Arrival Method: Bed Mental Orientation: A&OX4 Telemetry:3EBOX 07 Assessment: Completed Skin: Intact, With left and right arm skin discoloration with red spots Pain:0/10 Tubes: None Safety Measures: Safety Fall Prevention Plan has been discussed. Patient educated about bed alarm and safety but still refuses bed alarm.  Admission:  [facility] [place] Orientation: Patient has been orientated to the room, unit and staff.  Family: none at bedside   Orders to be reviewed and implemented. Will continue to monitor the patient. Call light has been placed within reach and bed alarm has been activated.   Bettey MareJasmina Nida Manfredi, RN Phone: [phone]

## 2016-03-11 LAB — BORDETELLA PERTUSSIS PCR
B PARAPERTUSSIS, DNA: NEGATIVE
B pertussis, DNA: NEGATIVE

## 2016-03-24 NOTE — Discharge Summary (Addendum)
[facility] Discharge Summary   Patient: Gabriel Estes ZOX:[id number]   PCP: No primary care provider on file. DOB: 07/12/1955   Date of admission: 03/08/2016   Date of discharge: 03/09/2016    Discharge Diagnoses:  Active Problems:   COPD exacerbation (HCC)   Syncope   Acute respiratory failure with hypoxia (HCC)   Admitted From: home Disposition:  home  Recommendations for Outpatient Follow-up:  1. Please follow up with PCP in 1 week   Follow-up Information    Schedule an appointment as soon as possible for a visit with [facility].   Why:  Follow-up with a primary doctor in 3-5 days as needed. If you do not have a doctor, you can call this number to set up an appointment with one. Contact information: [address] [place] [address] [phone]       [facility] EMERGENCY DEPARTMENT Follow up.   Specialty:  Emergency Medicine Why:  If symptoms worsen Contact information: [address] [id number] mc [place] [address] [phone]         Diet recommendation: cardiac diet  Activity: The patient is advised to gradually reintroduce usual activities.  Discharge Condition: good  Code Status: full code  History of present illness: As per the H and P dictated on admission, "Gabriel Estes is a 61 y.o. male with medical history significant of tobacco abuse. Presented with episode of syncope that happened after he was having severe coughing spasm he was found unresponsive and pickup truck at the [place] station 5 department administered Narcan patient denies any drug abuse patient did Y Coppock. Patient had an episode of vomiting and Route reports has been having shortness of breath and coughing and wheezing for past few weeks and overall not feeling well. In route patient was given 5 mg of albuterol was found to have 92% on Ramirez 96% 4 L initial heart rate 92 BP 124/84 he had  had a pain or chest pains or significant shortness of breath states he passed out after he was having severe coughing spell she remembers feeling lightheaded and going out. Cough with significant sputum production he also had had some Raynaud's. Patient is a smoker but currently interested in quitting he have not had any fevers or chills no prior syncopal episodes has no history of coronary artery disease status denies any sick contacts. Reports some night sweats.  He reports right side discomfort with movement. "  Hospital Course:  Summary of his active problems in the hospital is as following. Active Problems:   COPD exacerbation (HCC) Respiratory failure resolved rapidly. Continue antibiotics at home with oral prednisone Add mucinex Cough medicine added  Negative pertussis, influenza,   All other chronic medical condition were stable during the hospitalization.  Patient was ambulatory without any assistance. On the day of the discharge the patient's home, and no other acute medical condition were reported by patient. the patient was felt safe to be discharge at home.  Procedures and Results:  none   Consultations:  none  DISCHARGE MEDICATION: Discharge Medication List as of 03/09/2016 11:25 AM    START taking these medications   Details  guaiFENesin (MUCINEX) 600 MG 12 hr tablet Take 1 tablet (600 mg total) by mouth 2 (two) times daily., Starting Sat 03/09/2016, Print    polyethylene glycol (MIRALAX / GLYCOLAX) packet Take 17 g by mouth daily as needed for mild constipation., Starting Sat 03/09/2016, Print  CONTINUE these medications which have CHANGED   Details  amoxicillin-clavulanate (AUGMENTIN) 875-125 MG tablet Take 1 tablet by mouth every 12 (twelve) hours., Starting Sat 03/09/2016, Until Sat 03/16/2016, Print    azithromycin (ZITHROMAX) 250 MG tablet Take 1 tablet (250 mg total) by mouth daily., Starting Sat 03/09/2016, Until Sat 03/16/2016, Print    benzonatate  (TESSALON PERLES) 100 MG capsule Take 1 capsule (100 mg total) by mouth 3 (three) times daily as needed for cough., Starting Sat 03/09/2016, Print    HYDROcodone-homatropine (HYCODAN) 5-1.5 MG/5ML syrup Take 5 mLs by mouth every 6 (six) hours as needed for cough., Starting Sat 03/09/2016, Until Fri 03/15/2016, Print    predniSONE (DELTASONE) 10 MG tablet Take 50mg  daily for 3days,Take 40mg  daily for 3days,Take 30mg  daily for 3days,Take 20mg  daily for 3days,Take 10mg  daily for 3days, then stop, Print       No Known Allergies Discharge Instructions    Diet general    Complete by:  As directed    Discharge instructions    Complete by:  As directed    It is important that you read following instructions as well as go over your medication list with RN to help you understand your care after this hospitalization.  Discharge Instructions: Please follow-up with PCP in one week  Please request your primary care physician to go over all Hospital Tests and Procedure/Radiological results at the follow up,  Please get all Hospital records sent to your PCP by signing hospital release before you go home.   Do not drive, operating heavy machinery, perform activities at heights, swimming or participation in water activities or provide baby sitting services while your are on Pain, Sleep and Anxiety Medications; until you have been seen by Primary Care Physician or a Neurologist and advised to do so again. Do not take more than prescribed Pain, Sleep and Anxiety Medications. You were cared for by a hospitalist during your hospital stay. If you have any questions about your discharge medications or the care you received while you were in the hospital after you are discharged, you can call the unit and ask to speak with the hospitalist on call if the hospitalist that took care of you is not available.  Once you are discharged, your primary care physician will handle any further medical issues. Please note that  NO REFILLS for any discharge medications will be authorized once you are discharged, as it is imperative that you return to your primary care physician (or establish a relationship with a primary care physician if you do not have one) for your aftercare needs so that they can reassess your need for medications and monitor your lab values. You Must read complete instructions/literature along with all the possible adverse reactions/side effects for all the Medicines you take and that have been prescribed to you. Take any new Medicines after you have completely understood and accept all the possible adverse reactions/side effects. Wear Seat belts while driving. If you have smoked or chewed Tobacco in the last 2 yrs please stop smoking and/or stop any Recreational drug use.   Increase activity slowly    Complete by:  As directed      Discharge Exam: Filed Weights   03/09/16 0108  Weight: 88.8 kg (195 lb 11.2 oz)   Vitals:   03/09/16 0108 03/09/16 0538  BP: 136/88 126/87  Pulse: (!) 122 (!) 105  Resp: 18 18  Temp: 98.5 F (36.9 C) 98.8 F (37.1 C)   General: Appear in no  distress, no Rash; Oral Mucosa moist. Cardiovascular: S1 and S2 Present, no Murmur, no JVD Respiratory: Bilateral Air entry present and Clear to Auscultation, no Crackles, no wheezes Abdomen: Bowel Sound present, Soft and no tenderness Extremities: no Pedal edema, no calf tenderness Neurology: Grossly no focal neuro deficit.  The results of significant diagnostics from this hospitalization (including imaging, microbiology, ancillary and laboratory) are listed below for reference.    Significant Diagnostic Studies: Dg Chest 2 View  Result Date: 03/08/2016 CLINICAL DATA:  Cough for 3 weeks, passed out after coughing episode today EXAM: CHEST  2 VIEW COMPARISON:  None FINDINGS: Normal heart size, mediastinal contours, and pulmonary vascularity. Lungs hyperinflated but clear. No pulmonary infiltrate, pleural effusion or  pneumothorax. Bones unremarkable. IMPRESSION: Hyperinflated lungs without infiltrate. Electronically Signed   By: Ulyses SouthwardMark  Boles M.D.   On: 03/08/2016 19:19   Ct Angio Chest Pe W Or Wo Contrast  Result Date: 03/08/2016 CLINICAL DATA:  Chest pain with shortness of breath EXAM: CT ANGIOGRAPHY CHEST WITH CONTRAST TECHNIQUE: Multidetector CT imaging of the chest was performed using the standard protocol during bolus administration of intravenous contrast. Multiplanar CT image reconstructions and MIPs were obtained to evaluate the vascular anatomy. CONTRAST:  100 mL Isovue 370 intravenous COMPARISON:  Chest x-ray 03/08/2016 FINDINGS: Cardiovascular: Satisfactory opacification of the pulmonary arteries to the segmental level. No evidence of pulmonary embolism. Normal heart size. No pericardial effusion. No aortic dissection. Thoracic aorta is non aneurysmal. Minimal coronary artery calcifications. Mediastinum/Nodes: No significantly enlarged mediastinal or hilar lymph nodes. Trachea is midline. Thyroid normal. Esophagus within normal limits. Lungs/Pleura: No pneumothorax. No pleural effusion or consolidation. Minimal right upper lobe subpleural hazy density with minimal centrilobular pattern. Upper Abdomen: No acute abnormalities in the upper abdomen. Musculoskeletal: Degenerative changes.  No suspicious bone lesions. Review of the MIP images confirms the above findings. IMPRESSION: 1. No definite CT evidence for acute pulmonary embolus or aortic dissection 2. Minimal hazy density and questionable centrilobular pattern in the peripheral right upper lobe could relate to small focus of pneumonitis or small airways infection/bronchiolitis. There is no focal pneumonia or pleural effusion identified. Electronically Signed   By: Jasmine PangKim  Fujinaga M.D.   On: 03/08/2016 21:41    Time spent: 30 minutes  Signed:  Lynden OxfordEL, Antinio Sanderfer  [facility] 03/09/2016 , 4:04 PM
